# Patient Record
Sex: Male | Born: 2000 | Hispanic: Yes | Marital: Single | Smoking: Never smoker
Health system: Southern US, Community
[De-identification: ages and names within clinical notes are randomized; demographics above are authoritative.]

---
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
## 2018-07-08 ENCOUNTER — Emergency Department: Payer: Medicaid Other

## 2018-07-08 ENCOUNTER — Encounter: Payer: Self-pay | Admitting: Intensive Care

## 2018-07-08 ENCOUNTER — Other Ambulatory Visit: Payer: Self-pay

## 2018-07-08 ENCOUNTER — Emergency Department
Admission: EM | Admit: 2018-07-08 | Discharge: 2018-07-08 | Disposition: A | Discharge status: Home / Self Care | Payer: Medicaid Other | Attending: Emergency Medicine | Admitting: Emergency Medicine

## 2018-07-08 DIAGNOSIS — K852 Alcohol induced acute pancreatitis without necrosis or infection: Secondary | ICD-10-CM | POA: Diagnosis not present

## 2018-07-08 DIAGNOSIS — R1013 Epigastric pain: Secondary | ICD-10-CM | POA: Diagnosis not present

## 2018-07-08 DIAGNOSIS — R110 Nausea: Secondary | ICD-10-CM | POA: Insufficient documentation

## 2018-07-08 DIAGNOSIS — R0789 Other chest pain: Secondary | ICD-10-CM | POA: Diagnosis present

## 2018-07-08 LAB — BASIC METABOLIC PANEL
Anion gap: 13 (ref 5–15)
BUN: 9 mg/dL (ref 6–20)
CO2: 21 mmol/L — ABNORMAL LOW (ref 22–32)
Calcium: 9.8 mg/dL (ref 8.9–10.3)
Chloride: 101 mmol/L (ref 98–111)
Creatinine, Ser: 0.72 mg/dL (ref 0.61–1.24)
GFR calc Af Amer: >60 mL/min (ref >60)
GFR calc non Af Amer: >60 mL/min (ref >60)
Glucose, Bld: 117 mg/dL — ABNORMAL HIGH (ref 70–99)
Potassium: 3.8 mmol/L (ref 3.5–5.1)
Sodium: 135 mmol/L (ref 135–145)

## 2018-07-08 LAB — CBC
HCT: 49.5 % (ref 39.0–52.0)
Hemoglobin: 17.3 g/dL — ABNORMAL HIGH (ref 13.0–17.0)
MCH: 29.1 pg (ref 26.0–34.0)
MCHC: 34.9 g/dL (ref 30.0–36.0)
MCV: 83.2 fL (ref 80.0–100.0)
Platelets: 340 10*3/uL (ref 150–400)
RBC: 5.95 MIL/uL — ABNORMAL HIGH (ref 4.22–5.81)
RDW: 12.6 % (ref 11.5–15.5)
WBC: 15.7 10*3/uL — ABNORMAL HIGH (ref 4.0–10.5)
nRBC: 0 % (ref 0.0–0.2)

## 2018-07-08 LAB — TROPONIN I (HIGH SENSITIVITY): Troponin I (High Sensitivity): <2 ng/L (ref <18)

## 2018-07-08 MED ORDER — ONDANSETRON HCL 4 MG/2ML IJ SOLN
4.0000 mg | Freq: Once | INTRAMUSCULAR | Status: AC
  Administered 2018-07-08: 19:00:00 4 mg via INTRAVENOUS
  Filled 2018-07-08: qty 2

## 2018-07-08 MED ORDER — ALUM & MAG HYDROXIDE-SIMETH 200-200-20 MG/5ML PO SUSP
30.0000 mL | Freq: Once | ORAL | Status: AC
  Administered 2018-07-08: 20:00:00 30 mL via ORAL
  Filled 2018-07-08: qty 30

## 2018-07-08 MED ORDER — SODIUM CHLORIDE 0.9 % IV BOLUS
1000.0000 mL | Freq: Once | INTRAVENOUS | Status: AC
  Administered 2018-07-08: 19:00:00 1000 mL via INTRAVENOUS

## 2018-07-08 MED ORDER — IOHEXOL 300 MG/ML  SOLN
100.0000 mL | Freq: Once | INTRAMUSCULAR | Status: AC | PRN
  Administered 2018-07-08: 20:00:00 100 mL via INTRAVENOUS
  Filled 2018-07-08: qty 100

## 2018-07-08 MED ORDER — IOHEXOL 240 MG/ML SOLN
50.0000 mL | Freq: Once | INTRAMUSCULAR | Status: AC | PRN
  Administered 2018-07-08: 19:00:00 50 mL via ORAL
  Filled 2018-07-08: qty 50

## 2018-07-08 NOTE — ED Notes (Signed)
See triage note  Presents with some epigastric pain and n/v states he developed pain after eating something hot    Then developed some n/v  Last time vomited was about 2 hours ago

## 2018-07-08 NOTE — ED Triage Notes (Signed)
Patient c/o central chest pressure that started yesterday. Little relief with OTC acid reflux meds. Continuing pain is what brought patient in. Also c/o abd pain all over with nausea. He reports this is an ongoing issue

## 2018-07-08 NOTE — ED Provider Notes (Signed)
[facility] Emergency Department Provider Note   ____________________________________________   First MD Initiated Contact with Patient 07/08/18 1758     (approximate)  I have reviewed the triage vital signs and the nursing notes.   HISTORY  Chief Complaint Chest Pain and Abdominal Pain    HPI Kenneth Gardner is a 18 y.o. male presents with low central chest pain which she described as pressure.  Patient state onset was yesterday.  Patient did the night before he did eat some spicy food and thought that might be causing his epigastric pain.  Patient state mild transient relief over-the-counter antacids.  Patient did pain has continued today and is now associated with nausea.  He denies vomiting or diarrhea.  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  Patient has intermittent episodes of constipation.  Patient rates his pain as a 6/10 today patient scribes pain is "achy".  No other palliative measures for complaint.         History reviewed. No pertinent past medical history.  There are no active problems to display for this patient.   History reviewed. No pertinent surgical history.  Prior to Admission medications   Not on File    Allergies Patient has no known allergies.  History reviewed. No pertinent family history.  Social History Social History   Tobacco Use   Smoking status: Never Smoker   Smokeless tobacco: Never Used  Substance Use Topics   Alcohol use: Yes    Alcohol/week: 12.0 standard drinks    Types: 12 Cans of beer per week   Drug use: Never    Review of Systems Constitutional: No fever/chills Eyes: No visual changes. ENT: No sore throat. Cardiovascular: Denies chest pain. Respiratory: Denies shortness of breath. Gastrointestinal: Epigastric pain.  Nausea without vomiting or diarrhea.   Genitourinary: Negative  for dysuria. Musculoskeletal: Negative for back pain. Skin: Negative for rash. Neurological: Negative for headaches, focal weakness or numbness.   ____________________________________________   PHYSICAL EXAM:  VITAL SIGNS: ED Triage Vitals [07/08/18 1437]  Enc Vitals Group     BP (!) 137/98     Pulse Rate 100     Resp 16     Temp 99.8 F (37.7 C)     Temp Source Oral     SpO2 98 %     Weight 178 lb (80.7 kg)     Height 5\' 5"  (1.651 m)     Head Circumference      Peak Flow      Pain Score 6     Pain Loc      Pain Edu?      Excl. in [place]?    Constitutional: Alert and oriented. Well appearing and in no acute distress. Mouth/Throat: Mucous membranes are moist.  Oropharynx non-erythematous. Neck: No stridor.   Hematological/Lymphatic/Immunilogical: No cervical lymphadenopathy. Cardiovascular: Normal rate, regular rhythm. Grossly normal heart sounds.  Good peripheral circulation. Respiratory: Normal respiratory effort.  No retractions. Lungs CTAB. Gastrointestinal: Soft and nontender. No distention. No abdominal bruits. No CVA tenderness. Genitourinary: Deferred Skin:  Skin is warm, dry and intact. No rash noted. Psychiatric: Mood and affect are normal. Speech and behavior are normal.  ____________________________________________   LABS (all labs ordered are listed, but only abnormal results are displayed)  Labs Reviewed  BASIC METABOLIC PANEL - Abnormal; Notable for the following components:      Result Value   CO2 21 (*)  Glucose, Bld 117 (*)    All other components within normal limits  CBC - Abnormal; Notable for the following components:   WBC 15.7 (*)    RBC 5.95 (*)    Hemoglobin 17.3 (*)    All other components within normal limits  TROPONIN I (HIGH SENSITIVITY)  TROPONIN I (HIGH SENSITIVITY)   ____________________________________________  EKG  Read by heart station Dr. ____________________________________________  RADIOLOGY  ED MD  interpretation: Official radiology report(s): Dg Chest 2 View  Result Date: 07/08/2018 CLINICAL DATA:  Chest pressure EXAM: CHEST - 2 VIEW COMPARISON:  No recent. FINDINGS: Mediastinum and hilar structures are normal. Heart size normal. Mild left mid lung field infiltrate cannot be excluded. No pleural effusion or pneumothorax. Thoracic spine scoliosis concave left. IMPRESSION: Very mild left mid lung field infiltrate cannot be excluded. Electronically Signed   By: Maisie Fushomas  Register   On: 07/08/2018 15:42   Ct Abdomen Pelvis W Contrast  Result Date: 07/08/2018 CLINICAL DATA:  Epigastric pain. EXAM: CT ABDOMEN AND PELVIS WITH CONTRAST TECHNIQUE: Multidetector CT imaging of the abdomen and pelvis was performed using the standard protocol following bolus administration of intravenous contrast. CONTRAST:  100mL OMNIPAQUE IOHEXOL 300 MG/ML  SOLN COMPARISON:  None. FINDINGS: Lower chest: No acute abnormality. Hepatobiliary: There is a 5.1 x 4 cm hyperechoic lesion in hepatic segment 6/7. There may be underlying hepatic steatosis. There is a central focal hypoattenuating area within this lesion. The gallbladder is unremarkable. Pancreas: There is diffuse peripancreatic fat stranding and free fluid. The pancreas appears to enhance symmetrically. Spleen: Normal in size without focal abnormality. Adrenals/Urinary Tract: Adrenal glands are unremarkable. Kidneys are normal, without renal calculi, focal lesion, or hydronephrosis. Bladder is unremarkable. Stomach/Bowel: Stomach is within normal limits. Appendix appears normal. No evidence of bowel wall thickening, distention, or inflammatory changes. Vascular/Lymphatic: No significant vascular findings are present. No enlarged abdominal or pelvic lymph nodes. Reproductive: Uterus and bilateral adnexa are unremarkable. Other: No abdominal wall hernia or abnormality. No abdominopelvic ascites. Musculoskeletal: No acute or significant osseous findings. IMPRESSION: 1. Findings  consistent with acute uncomplicated pancreatitis. 2. There is a 5 cm hyperattenuating mass in hepatic segment 6/7. This is incompletely characterized on this exam. Follow-up with an outpatient liver mass protocol MRI is recommended for further evaluation of this finding. Electronically Signed   By: Katherine Mantlehristopher  Green M.D.   On: 07/08/2018 20:24   ____________________________________________   PROCEDURES  Procedure(s) performed (including Critical Care):  Procedures   ____________________________________________   INITIAL IMPRESSION / ASSESSMENT AND PLAN / ED COURSE  As part of my medical decision making, I reviewed the following data within the electronic MEDICAL RECORD NUMBER     Patient complain of anterior low chest pain and upper abdominal pain associated with nausea.  Onset of complaint was yesterday after eating spicy food.  Mild translesional over-the-counter antacid.  Patient the pain has increased today.  Patient has a history of Hirschsprung disease which required a partial colectomy at age 18.  Patient intermittent bouts of diarrhea.  Differential consist pancreatitis, gastric reflux, hernia, and bowel obstruction.  In view the patient elevated white blood count he will be further evaluated with a CT of the abdomen.  Cussed CT results with patient.  Patient given discharge care instruction advised follow gastroenterology for definitive evaluation and treatment.       ____________________________________________   FINAL CLINICAL IMPRESSION(S) / ED DIAGNOSES  Final diagnoses:  Alcohol-induced acute pancreatitis, unspecified complication status     ED Discharge Orders  None       Note:  This document was prepared using Dragon voice recognition software and may include unintentional dictation errors.    Joni ReiningSmith, Shauntay Brunelli K, PA-C 07/08/18 2045    Emily FilbertWilliams, Jonathan E, MD 07/08/18 2130

## 2018-11-24 DIAGNOSIS — K859 Acute pancreatitis without necrosis or infection, unspecified: Secondary | ICD-10-CM | POA: Insufficient documentation

## 2018-11-24 DIAGNOSIS — R7401 Elevation of levels of liver transaminase levels: Secondary | ICD-10-CM | POA: Insufficient documentation

## 2019-05-13 ENCOUNTER — Encounter: Payer: Self-pay | Admitting: Gastroenterology

## 2019-05-13 ENCOUNTER — Other Ambulatory Visit
Admission: RE | Admit: 2019-05-13 | Discharge: 2019-05-13 | Disposition: A | Discharge status: Home / Self Care | Payer: Medicaid Other | Attending: Gastroenterology | Admitting: Gastroenterology

## 2019-05-13 ENCOUNTER — Other Ambulatory Visit: Payer: Self-pay

## 2019-05-13 ENCOUNTER — Ambulatory Visit (INDEPENDENT_AMBULATORY_CARE_PROVIDER_SITE_OTHER): Payer: Medicaid Other | Admitting: Gastroenterology

## 2019-05-13 VITALS — BP 116/73 | HR 59 | Temp 98.8°F | Ht 65.0 in | Wt 178.4 lb

## 2019-05-13 DIAGNOSIS — K852 Alcohol induced acute pancreatitis without necrosis or infection: Secondary | ICD-10-CM | POA: Diagnosis not present

## 2019-05-13 DIAGNOSIS — K769 Liver disease, unspecified: Secondary | ICD-10-CM | POA: Diagnosis not present

## 2019-05-13 NOTE — Patient Instructions (Signed)
MRI (MRCP) scheduled at [facility] on Sunday, May 9th at 4:00pm. Please Arrive at the Medical mall registration desk at 3:30pm. You cannot have anything to eat or drink after 12:00pm.   If you need to cancel or reschedule this image for any reason, please contact central scheduling at [phone] and follow the prompts.

## 2019-05-13 NOTE — Progress Notes (Signed)
Gastroenterology Consultation  Referring Provider:     No ref. provider found Primary Care Physician:  Patient, No Pcp Per Primary Gastroenterologist:  Dr. Servando Snare     Reason for Consultation:     Recurrent pancreatitis        HPI:   Kenneth Gardner is a 19 y.o. y/o male referred for consultation & management of recurrent pancreatitis by Dr. Patient, No Pcp Per.  This patient comes in today with multiple episodes of pancreatitis.  The patient states he was even in the hospital for 4 days.  The patient's lipase on multiple admissions have shown a lipase of 3000 and in September 2020, 5700 in November 2020 with 16,000 and being the peak back in November 2020.  The patient was recently at [facility] with a lipase of 11,696.  The patient reports that he has a long history of alcohol abuse and was told to stop drinking.  His last attack of pancreatitis was after he had stopped drinking from the prior admission.  He also had imaging that showed him to have a lesion on the liver that was suggestive of possible focal nodular hyperplasia.  He was suggested to undergo an MRI for that area to delineate what it may be.  Patient has no abdominal pain at the present time.  He denies any supplemental medications or health drinks.  The patient also has had a elevated ALT with his most recent ALT of 52.  The patient has not had any imaging showing any gallstones or dilation of the common bile duct.  He also reports that he was diagnosed and treated for Hirschsprung's disease.  No past medical history on file.  No past surgical history on file.  Prior to Admission medications   Not on File    No family history on file.   Social History   Tobacco Use  . Smoking status: Never Smoker  . Smokeless tobacco: Never Used  Substance Use Topics  . Alcohol use: Yes    Alcohol/week: 12.0 standard drinks    Types: 12 Cans of beer per week  . Drug use: Never    Allergies as of 05/13/2019  . (No Known Allergies)     Review of Systems:    All systems reviewed and negative except where noted in HPI.   Physical Exam:  There were no vitals taken for this visit. No LMP for male patient. General:   Alert,  Well-developed, well-nourished, pleasant and cooperative in NAD Head:  Normocephalic and atraumatic. Eyes:  Sclera clear, no icterus.   Conjunctiva pink. Ears:  Normal auditory acuity. Neck:  Supple; no masses or thyromegaly. Lungs:  Respirations even and unlabored.  Clear throughout to auscultation.   No wheezes, crackles, or rhonchi. No acute distress. Heart:  Regular rate and rhythm; no murmurs, clicks, rubs, or gallops. Abdomen:  Normal bowel sounds.  No bruits.  Soft, non-tender and non-distended without masses, hepatosplenomegaly or hernias noted.  No guarding or rebound tenderness.  Negative Carnett sign.   Rectal:  Deferred.  Pulses:  Normal pulses noted. Extremities:  No clubbing or edema.  No cyanosis. Neurologic:  Alert and oriented x3;  grossly normal neurologically. Skin:  Intact without significant lesions or rashes.  No jaundice. Lymph Nodes:  No significant cervical adenopathy. Psych:  Alert and cooperative. Normal mood and affect.  Imaging Studies: No results found.  Assessment and Plan:   Kenneth Gardner is a 19 y.o. y/o male who comes in with recurrent pancreatitis.  The patient has been told that it may be from his extensive alcohol use.  The patient is no longer drinking.  The patient will be set up for an MRI with MRCP to look at the pancreatic duct for possible signs of chronic pancreatitis and to look at the liver lesion.  The patient will also have his blood sent off for IgG4 and ANA for possible autoimmune pancreatitis.  If these are negative genetic counseling may be recommended to look for other hereditary causes of pancreatitis.  The patient has been explained the plan agrees with it.    Lucilla Lame, MD. Marval Regal    Note: This dictation was prepared with Dragon  dictation along with smaller phrase technology. Any transcriptional errors that result from this process are unintentional.

## 2019-05-14 LAB — ANA: Anti Nuclear Antibody (ANA): NEGATIVE

## 2019-05-16 LAB — IGG 4: IgG, Subclass 4: 48 mg/dL (ref 2–96)

## 2019-05-18 NOTE — Progress Notes (Signed)
Let the patient know the auto-immune markers were normal so he does not have auto-immune panreatitis.

## 2019-05-24 ENCOUNTER — Ambulatory Visit: Admission: RE | Admit: 2019-05-24 | Payer: Medicaid Other | Source: Ambulatory Visit

## 2019-06-02 ENCOUNTER — Telehealth: Payer: Self-pay

## 2019-06-02 NOTE — Telephone Encounter (Signed)
Left voicemail for pt to return my call regarding results as well as rescheduling MRI.

## 2019-06-02 NOTE — Telephone Encounter (Signed)
-----   Message from Midge Minium, MD sent at 05/18/2019  1:22 PM EDT ----- Let the patient know the auto-immune markers were normal so he does not have auto-immune panreatitis.

## 2019-06-03 ENCOUNTER — Other Ambulatory Visit: Payer: Self-pay | Admitting: Gastroenterology

## 2019-06-03 ENCOUNTER — Other Ambulatory Visit: Payer: Self-pay

## 2019-06-03 ENCOUNTER — Ambulatory Visit
Admission: RE | Admit: 2019-06-03 | Discharge: 2019-06-03 | Disposition: A | Discharge status: Home / Self Care | Payer: Medicaid Other | Source: Ambulatory Visit | Attending: Gastroenterology | Admitting: Gastroenterology

## 2019-06-03 DIAGNOSIS — K769 Liver disease, unspecified: Secondary | ICD-10-CM | POA: Insufficient documentation

## 2019-06-03 MED ORDER — GADOBUTROL 1 MMOL/ML IV SOLN
8.0000 mL | Freq: Once | INTRAVENOUS | Status: AC | PRN
  Administered 2019-06-03: 8 mL via INTRAVENOUS

## 2019-06-08 ENCOUNTER — Telehealth: Payer: Self-pay

## 2019-06-08 NOTE — Telephone Encounter (Signed)
Pt notified of lab results and MRI results.

## 2019-06-08 NOTE — Telephone Encounter (Signed)
-----   Message from Midge Minium, MD sent at 06/07/2019  8:14 AM EDT ----- Let the patient know that the MRI showed the spot on her liver to be focal nodular hyperplasia which is a benign condition and she does not need any further tests on it.  These are left alone unless they cause symptoms.  The pancreas was reported to appear normal.

## 2021-01-08 IMAGING — MR MR ABDOMEN WO/W CM MRCP
18 of 21 series · 43 of 48 positions shown · IV contrast (8ml Gadavist)
Comparison: CT on 07/08/2018

CLINICAL DATA: Indeterminate liver mass on prior CT.

EXAM:
MRI ABDOMEN WITHOUT AND WITH CONTRAST (INCLUDING MRCP)
TECHNIQUE: Multiplanar multisequence MR imaging of the abdomen was performed
both before and after the administration of intravenous contrast.
Heavily T2-weighted images of the biliary and pancreatic ducts were
obtained, and three-dimensional MRCP images were rendered by post
processing.
CONTRAST:  8mL GADAVIST GADOBUTROL 1 MMOL/ML IV SOLN

[Series 3: T2 · coronal · 6.0mm · 1.19mm/px · 1 of 32 slices shown (1 of 2)]
[im 1/32]
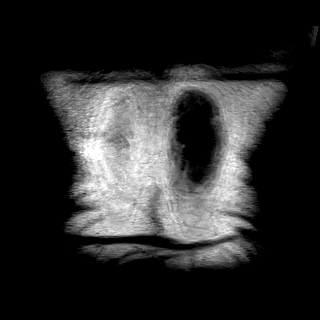

[Series 4: T2 · axial · 6.0mm · 1.19mm/px · 1 of 32 slices shown (2 of 2)]
[im 1/32]
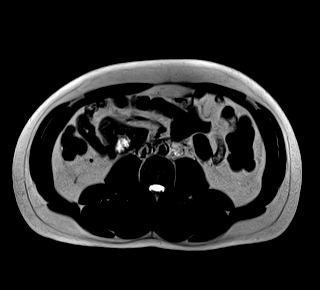

[Series 5: T1 · axial · 6.0mm · 0.74mm/px · z∈[-72,+151]mm · 3 of 64 slices shown]
[im 1/64]
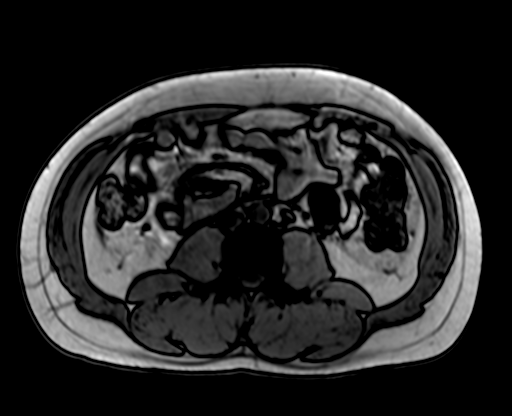
[im 32/64]
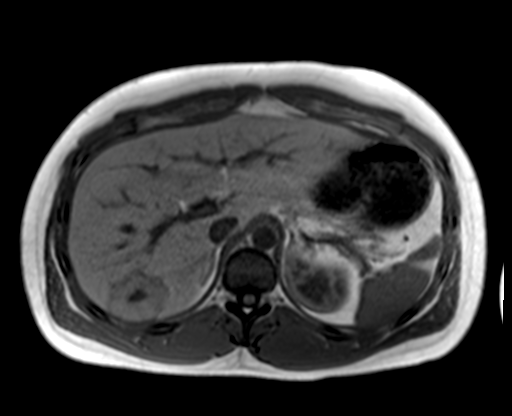
[im 64/64]
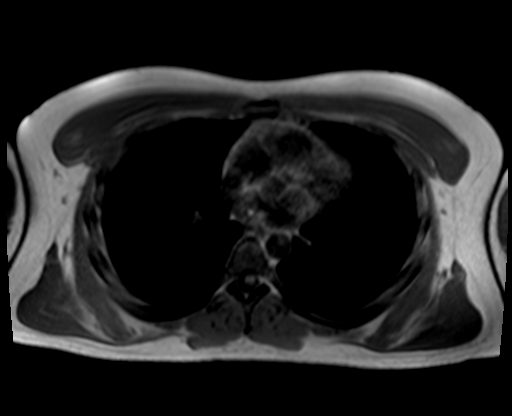

[Series 8: T2 fat-sat · axial · 6.0mm · 1.19mm/px · 1 of 32 slices shown]
[im 1/32]
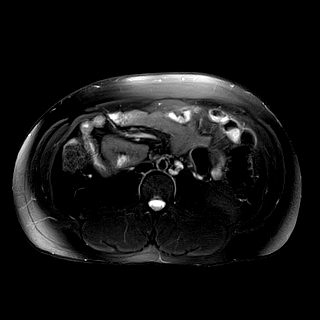

[Series 9: ax dwi_tracew · axial · 6.0mm · 1.42mm/px · z∈[-72,+151]mm · 4 of 96 slices shown]
[im 1/96]
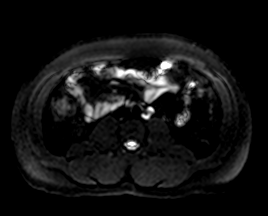
[im 32/96]
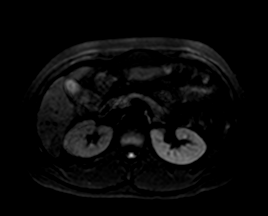
[im 64/96]
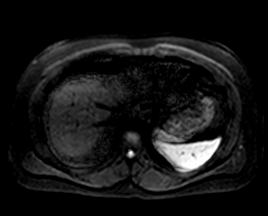
[im 96/96]
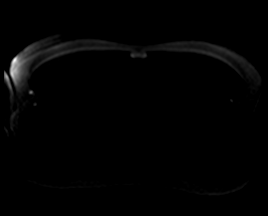

[Series 10: ax dwi_adc · axial · 6.0mm · 1.42mm/px · 1 of 32 slices shown]
[im 1/32]
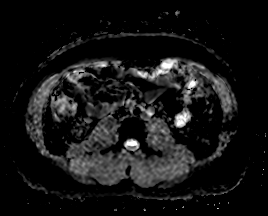

[Series 14: MRCP · coronal · 3.0mm · 1.12mm/px · 1 of 17 slices shown]
[im 1/17]
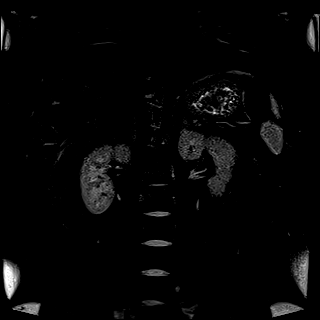

[Series 15: radials · coronal · 50.0mm · 0.78mm/px · 1 of 5 slices shown]
[im 1/5]
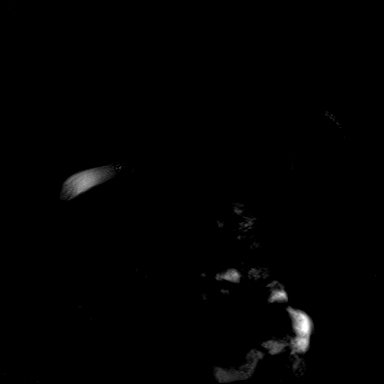

[Series 16: T1 dynamic fat-sat · axial · non-contrast · 3.5mm · 1.19mm/px · z∈[-84,+164]mm · 3 of 72 slices shown (1 of 5)]
[im 1/72]
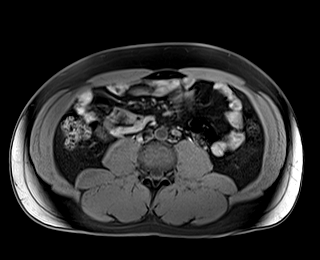
[im 36/72]
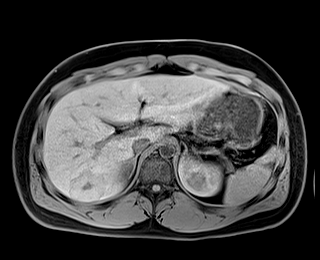
[im 72/72]
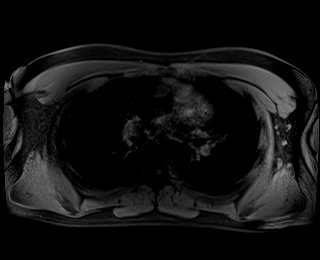

[Series 17: T1 dynamic fat-sat post-contrast · axial · 3.5mm · 1.19mm/px · z∈[-84,+164]mm · 3 of 72 slices shown (1 of 4)]
[im 1/72]
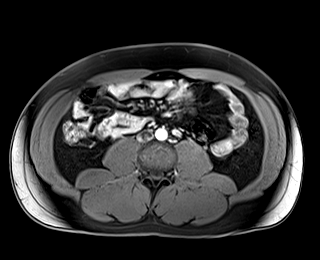
[im 36/72]
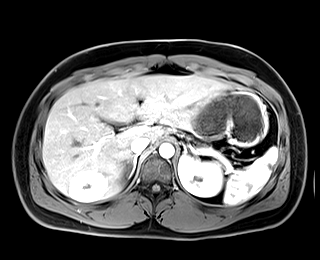
[im 72/72]
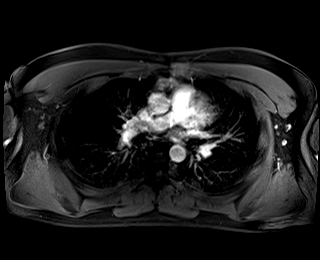

[Series 18: T1 dynamic fat-sat · axial · 3.5mm · 1.19mm/px · z∈[-84,+164]mm · 3 of 72 slices shown (2 of 5)]
[im 1/72]
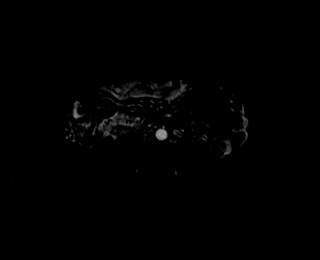
[im 36/72]
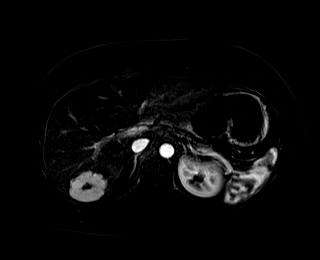
[im 72/72]
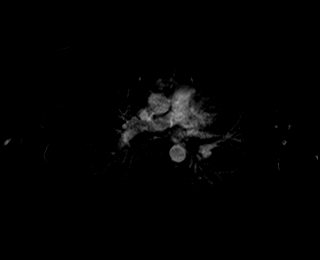

[Series 19: T1 dynamic fat-sat post-contrast · axial · 3.5mm · 1.19mm/px · z∈[-84,+164]mm · 3 of 72 slices shown (2 of 4)]
[im 1/72]
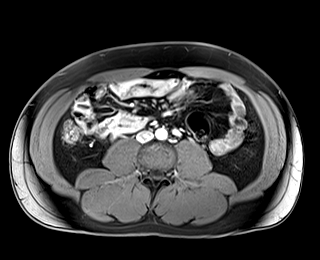
[im 36/72]
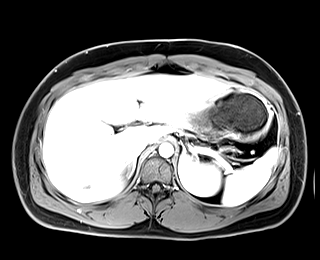
[im 72/72]
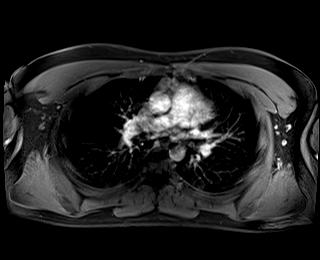

[Series 20: T1 dynamic fat-sat · axial · 3.5mm · 1.19mm/px · z∈[-84,+164]mm · 3 of 72 slices shown (3 of 5)]
[im 1/72]
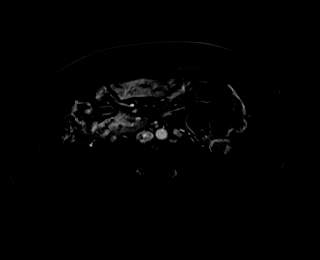
[im 36/72]
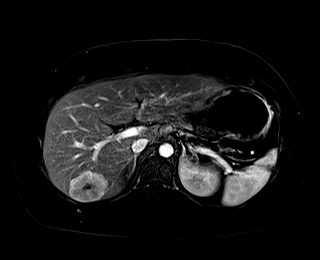
[im 72/72]
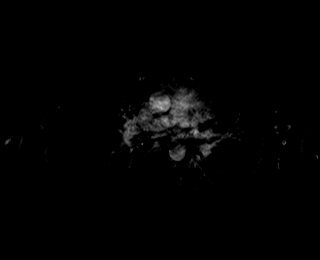

[Series 21: T1 dynamic fat-sat post-contrast · axial · 3.5mm · 1.19mm/px · z∈[-84,+164]mm · 3 of 72 slices shown (3 of 4)]
[im 1/72]
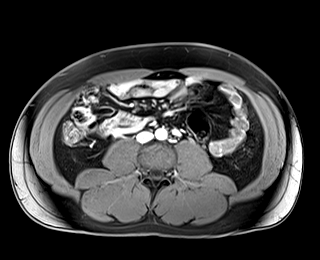
[im 36/72]
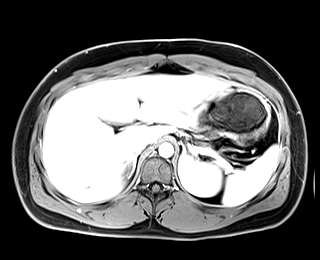
[im 72/72]
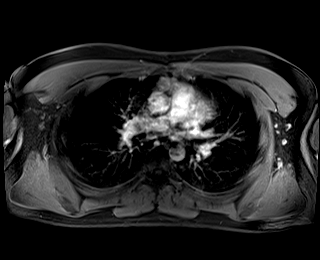

[Series 22: T1 dynamic fat-sat · axial · 3.5mm · 1.19mm/px · z∈[-84,+164]mm · 3 of 72 slices shown (4 of 5)]
[im 1/72]
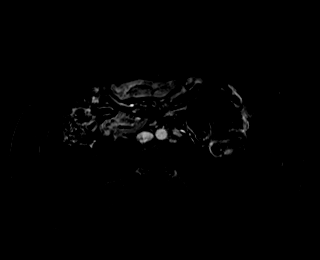
[im 36/72]
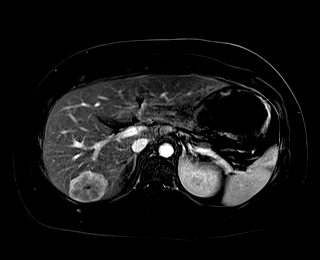
[im 72/72]
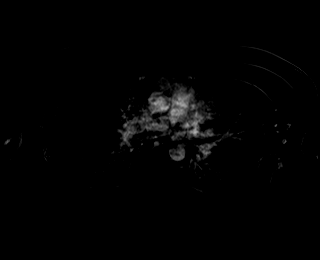

[Series 23: T1 dynamic post-contrast · coronal · 3.0mm · 1.31mm/px · 3 of 72 slices shown]
[im 1/72]
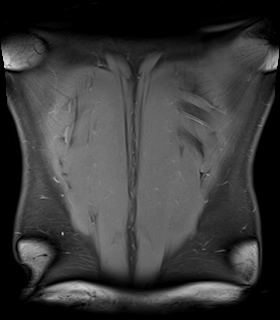
[im 36/72]
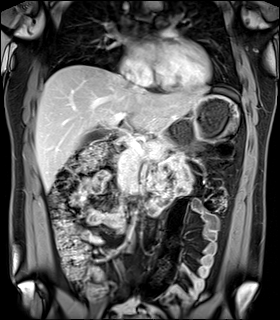
[im 72/72]
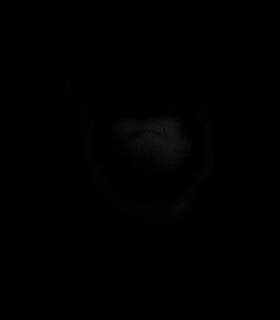

[Series 24: T1 dynamic fat-sat post-contrast · axial · 3.5mm · 1.19mm/px · z∈[-84,+164]mm · 3 of 72 slices shown (4 of 4)]
[im 1/72]
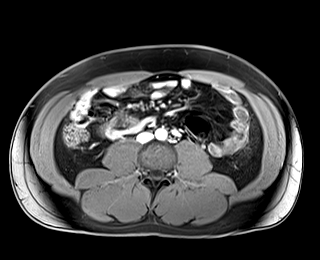
[im 36/72]
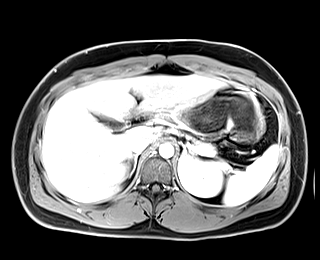
[im 72/72]
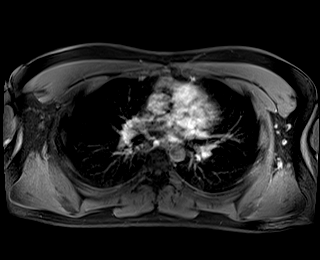

[Series 25: T1 dynamic fat-sat · axial · 3.5mm · 1.19mm/px · z∈[-84,+164]mm · 3 of 72 slices shown (5 of 5)]
[im 1/72]
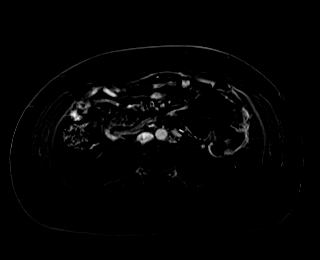
[im 36/72]
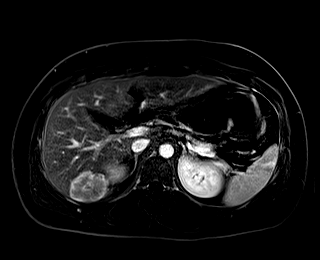
[im 72/72]
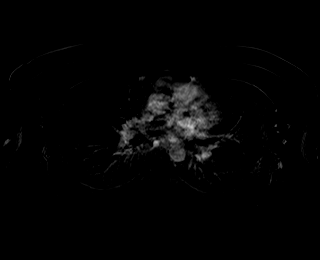

[43 of 48 positions shown; findings below may reference images not displayed]

FINDINGS: Lower chest: No acute findings.

Hepatobiliary: A mass is seen in the right lobe involving segment
[DATE] which measures 4.8 x 3.6 cm. This shows marked arterial phase
hyperenhancement, lack of contrast washout, minimal T2
hyperintensity and central scar, characteristic of benign focal
nodular hyperplasia. A 2nd sub-centimeter lesion is seen in segment
3 which shows similar arterial phase hyperenhancement and lack of
contrast washout, also consistent with focal nodular hyperplasia.
Gallbladder is unremarkable. No evidence of biliary ductal
dilatation.

Pancreas:  No mass or inflammatory changes.

Spleen:  Within normal limits in size and appearance.

Adrenals/Urinary Tract: No masses identified. No evidence of
hydronephrosis.

Stomach/Bowel: Visualized portion unremarkable.

Vascular/Lymphatic: No pathologically enlarged lymph nodes
identified. No abdominal aortic aneurysm.

Other:  None.

Musculoskeletal:  No suspicious bone lesions identified.
IMPRESSION: 1. 4.8 cm benign focal nodular hyperplasia in right hepatic lobe,
corresponding with lesion seen on recent CT. Second tiny less than 1
cm focal nodular hyperplasia also noted in left hepatic lobe.
2. No evidence of malignancy or other acute findings.
